# Patient Record
Sex: Female | Born: 1960 | Race: White | Hispanic: No | Marital: Single | Smoking: Never smoker
Health system: Southern US, Community
[De-identification: ages and names within clinical notes are randomized; demographics above are authoritative.]

---

## 2016-09-07 DIAGNOSIS — Z803 Family history of malignant neoplasm of breast: Secondary | ICD-10-CM | POA: Insufficient documentation

## 2017-08-05 DIAGNOSIS — E785 Hyperlipidemia, unspecified: Secondary | ICD-10-CM | POA: Insufficient documentation

## 2017-08-05 DIAGNOSIS — E559 Vitamin D deficiency, unspecified: Secondary | ICD-10-CM | POA: Insufficient documentation

## 2019-06-14 DIAGNOSIS — J310 Chronic rhinitis: Secondary | ICD-10-CM | POA: Diagnosis not present

## 2019-06-14 DIAGNOSIS — J343 Hypertrophy of nasal turbinates: Secondary | ICD-10-CM | POA: Diagnosis not present

## 2019-06-14 DIAGNOSIS — G4733 Obstructive sleep apnea (adult) (pediatric): Secondary | ICD-10-CM | POA: Diagnosis not present

## 2019-08-15 DIAGNOSIS — G4733 Obstructive sleep apnea (adult) (pediatric): Secondary | ICD-10-CM | POA: Diagnosis not present

## 2019-09-15 DIAGNOSIS — R05 Cough: Secondary | ICD-10-CM | POA: Diagnosis not present

## 2019-09-15 DIAGNOSIS — J029 Acute pharyngitis, unspecified: Secondary | ICD-10-CM | POA: Diagnosis not present

## 2019-09-15 DIAGNOSIS — Z20828 Contact with and (suspected) exposure to other viral communicable diseases: Secondary | ICD-10-CM | POA: Diagnosis not present

## 2019-09-15 DIAGNOSIS — G4733 Obstructive sleep apnea (adult) (pediatric): Secondary | ICD-10-CM | POA: Diagnosis not present

## 2019-10-08 DIAGNOSIS — J3489 Other specified disorders of nose and nasal sinuses: Secondary | ICD-10-CM | POA: Diagnosis not present

## 2019-10-08 DIAGNOSIS — Z20828 Contact with and (suspected) exposure to other viral communicable diseases: Secondary | ICD-10-CM | POA: Diagnosis not present

## 2019-10-15 DIAGNOSIS — G4733 Obstructive sleep apnea (adult) (pediatric): Secondary | ICD-10-CM | POA: Diagnosis not present

## 2019-11-15 DIAGNOSIS — G4733 Obstructive sleep apnea (adult) (pediatric): Secondary | ICD-10-CM | POA: Diagnosis not present

## 2019-12-16 DIAGNOSIS — G4733 Obstructive sleep apnea (adult) (pediatric): Secondary | ICD-10-CM | POA: Diagnosis not present

## 2019-12-28 ENCOUNTER — Ambulatory Visit: Payer: Self-pay | Attending: Family

## 2019-12-28 DIAGNOSIS — Z23 Encounter for immunization: Secondary | ICD-10-CM | POA: Insufficient documentation

## 2019-12-28 NOTE — Progress Notes (Signed)
   Covid-19 Vaccination Clinic  Name:  Amy Griffin    MRN: [id number] DOB: 26-Aug-1961  12/28/2019  [name] was observed post Covid-19 immunization for 15 minutes without incident. She was provided with Vaccine Information Sheet and instruction to access the V-Safe system.   [name] was instructed to call 911 with any severe reactions post vaccine: Marland Kitchen Difficulty breathing  . Swelling of face and throat  . A fast heartbeat  . A bad rash all over body  . Dizziness and weakness   Immunizations Administered    Name Date Dose VIS Date Route   Moderna COVID-19 Vaccine 12/28/2019  1:06 PM 0.5 mL 09/26/2019 Intramuscular   Manufacturer: Moderna   Lot: [id number]   [id number]

## 2019-12-30 ENCOUNTER — Ambulatory Visit: Payer: Self-pay

## 2020-01-13 DIAGNOSIS — G4733 Obstructive sleep apnea (adult) (pediatric): Secondary | ICD-10-CM | POA: Diagnosis not present

## 2020-01-17 DIAGNOSIS — G4733 Obstructive sleep apnea (adult) (pediatric): Secondary | ICD-10-CM | POA: Diagnosis not present

## 2020-01-30 ENCOUNTER — Ambulatory Visit: Payer: Self-pay | Attending: Family

## 2020-01-30 DIAGNOSIS — Z23 Encounter for immunization: Secondary | ICD-10-CM

## 2020-01-30 NOTE — Progress Notes (Signed)
   Covid-19 Vaccination Clinic  Name:  [name]    MRN: [id number] DOB: 03-Apr-1961  01/30/2020  [name] was observed post Covid-19 immunization for 15 minutes without incident. She was provided with Vaccine Information Sheet and instruction to access the V-Safe system.   [name] was instructed to call 911 with any severe reactions post vaccine: Marland Kitchen Difficulty breathing  . Swelling of face and throat  . A fast heartbeat  . A bad rash all over body  . Dizziness and weakness   Immunizations Administered    Name Date Dose VIS Date Route   Moderna COVID-19 Vaccine 01/30/2020  9:52 AM 0.5 mL 09/26/2019 Intramuscular   Manufacturer: Moderna   Lot: [id number]   [id number]

## 2020-02-13 DIAGNOSIS — G4733 Obstructive sleep apnea (adult) (pediatric): Secondary | ICD-10-CM | POA: Diagnosis not present

## 2020-03-14 DIAGNOSIS — G4733 Obstructive sleep apnea (adult) (pediatric): Secondary | ICD-10-CM | POA: Diagnosis not present

## 2020-04-14 DIAGNOSIS — G4733 Obstructive sleep apnea (adult) (pediatric): Secondary | ICD-10-CM | POA: Diagnosis not present

## 2020-04-18 DIAGNOSIS — R030 Elevated blood-pressure reading, without diagnosis of hypertension: Secondary | ICD-10-CM | POA: Diagnosis not present

## 2020-04-18 DIAGNOSIS — E7800 Pure hypercholesterolemia, unspecified: Secondary | ICD-10-CM | POA: Diagnosis not present

## 2020-04-18 DIAGNOSIS — Z0000 Encounter for general adult medical examination without abnormal findings: Secondary | ICD-10-CM | POA: Diagnosis not present

## 2020-05-17 ENCOUNTER — Other Ambulatory Visit: Payer: Self-pay | Admitting: Family Medicine

## 2020-05-17 DIAGNOSIS — Z1231 Encounter for screening mammogram for malignant neoplasm of breast: Secondary | ICD-10-CM

## 2020-06-07 ENCOUNTER — Other Ambulatory Visit: Payer: Self-pay

## 2020-06-07 ENCOUNTER — Ambulatory Visit
Admission: RE | Admit: 2020-06-07 | Discharge: 2020-06-07 | Disposition: A | Discharge status: Home / Self Care | Payer: BC Managed Care – PPO | Source: Ambulatory Visit | Attending: Family Medicine | Admitting: Family Medicine

## 2020-06-07 DIAGNOSIS — Z1231 Encounter for screening mammogram for malignant neoplasm of breast: Secondary | ICD-10-CM | POA: Diagnosis not present

## 2020-06-10 DIAGNOSIS — G4733 Obstructive sleep apnea (adult) (pediatric): Secondary | ICD-10-CM | POA: Diagnosis not present

## 2020-07-03 DIAGNOSIS — Z800 Family history of malignant neoplasm of digestive organs: Secondary | ICD-10-CM | POA: Diagnosis not present

## 2020-07-03 DIAGNOSIS — Z86010 Personal history of colonic polyps: Secondary | ICD-10-CM | POA: Diagnosis not present

## 2020-07-03 DIAGNOSIS — K219 Gastro-esophageal reflux disease without esophagitis: Secondary | ICD-10-CM | POA: Diagnosis not present

## 2020-07-12 DIAGNOSIS — Z1159 Encounter for screening for other viral diseases: Secondary | ICD-10-CM | POA: Diagnosis not present

## 2020-07-17 DIAGNOSIS — K635 Polyp of colon: Secondary | ICD-10-CM | POA: Diagnosis not present

## 2020-07-17 DIAGNOSIS — Z86010 Personal history of colonic polyps: Secondary | ICD-10-CM | POA: Diagnosis not present

## 2020-07-17 DIAGNOSIS — K640 First degree hemorrhoids: Secondary | ICD-10-CM | POA: Diagnosis not present

## 2020-07-17 DIAGNOSIS — K621 Rectal polyp: Secondary | ICD-10-CM | POA: Diagnosis not present

## 2020-07-17 DIAGNOSIS — D122 Benign neoplasm of ascending colon: Secondary | ICD-10-CM | POA: Diagnosis not present

## 2020-07-17 DIAGNOSIS — Z800 Family history of malignant neoplasm of digestive organs: Secondary | ICD-10-CM | POA: Diagnosis not present

## 2020-07-30 DIAGNOSIS — L918 Other hypertrophic disorders of the skin: Secondary | ICD-10-CM | POA: Diagnosis not present

## 2020-07-30 DIAGNOSIS — D1801 Hemangioma of skin and subcutaneous tissue: Secondary | ICD-10-CM | POA: Diagnosis not present

## 2020-07-30 DIAGNOSIS — L814 Other melanin hyperpigmentation: Secondary | ICD-10-CM | POA: Diagnosis not present

## 2020-07-30 DIAGNOSIS — L570 Actinic keratosis: Secondary | ICD-10-CM | POA: Diagnosis not present

## 2020-07-30 DIAGNOSIS — L578 Other skin changes due to chronic exposure to nonionizing radiation: Secondary | ICD-10-CM | POA: Diagnosis not present

## 2020-09-11 DIAGNOSIS — E7800 Pure hypercholesterolemia, unspecified: Secondary | ICD-10-CM | POA: Diagnosis not present

## 2020-09-11 DIAGNOSIS — R030 Elevated blood-pressure reading, without diagnosis of hypertension: Secondary | ICD-10-CM | POA: Diagnosis not present

## 2020-09-25 ENCOUNTER — Other Ambulatory Visit: Payer: Self-pay

## 2020-09-25 ENCOUNTER — Ambulatory Visit (INDEPENDENT_AMBULATORY_CARE_PROVIDER_SITE_OTHER): Payer: BC Managed Care – PPO | Admitting: Otolaryngology

## 2020-09-25 ENCOUNTER — Encounter (INDEPENDENT_AMBULATORY_CARE_PROVIDER_SITE_OTHER): Payer: Self-pay | Admitting: Otolaryngology

## 2020-09-25 VITALS — Temp 97.3°F

## 2020-09-25 DIAGNOSIS — G4733 Obstructive sleep apnea (adult) (pediatric): Secondary | ICD-10-CM | POA: Diagnosis not present

## 2020-09-25 DIAGNOSIS — D229 Melanocytic nevi, unspecified: Secondary | ICD-10-CM | POA: Diagnosis not present

## 2020-09-25 NOTE — Progress Notes (Signed)
HPI: Amy Griffin is a 59 y.o. female who returns today for evaluation of obstructive sleep apnea and use of nasal CPAP.  She states that she uses CPAP most of the time.  On her compliance report her usage is 80%.  I reviewed the sleep test with her today in the office that showed moderate severe obstructive sleep apnea with O2 sats below 88% of the time for 20 minutes while she slept which was 6% and the lowest O2 sat down to 77%. She also inquires about a mole on the right cheek just lateral to the nose that has gotten a little bit larger but she has had for a number of years.. She is on no blood thinners.  No past medical history on file. No past surgical history on file. Social History   Socioeconomic History   Marital status: Single    Spouse name: Not on file   Number of children: Not on file   Years of education: Not on file   Highest education level: Not on file  Occupational History   Not on file  Tobacco Use   Smoking status: Never Smoker   Smokeless tobacco: Never Used  Substance and Sexual Activity   Alcohol use: Not on file   Drug use: Not on file   Sexual activity: Not on file  Other Topics Concern   Not on file  Social History Narrative   Not on file   Social Determinants of Health   Financial Resource Strain:    Difficulty of Paying Living Expenses: Not on file  Food Insecurity:    Worried About [place] in the Last Year: Not on file   Ran Out of Food in the Last Year: Not on file  Transportation Needs:    Lack of Transportation (Medical): Not on file   Lack of Transportation (Non-Medical): Not on file  Physical Activity:    Days of Exercise per Week: Not on file   Minutes of Exercise per Session: Not on file  Stress:    Feeling of Stress : Not on file  Social Connections:    Frequency of Communication with Friends and Family: Not on file   Frequency of Social Gatherings with Friends and Family: Not on file    Attends Religious Services: Not on file   Active Member of Clubs or Organizations: Not on file   Attends Archivist Meetings: Not on file   Marital Status: Not on file   Family History  Problem Relation Age of Onset   Breast cancer Mother    No Known Allergies Prior to Admission medications   Not on File     Positive ROS: Otherwise negative  All other systems have been reviewed and were otherwise negative with the exception of those mentioned in the HPI and as above.  Physical Exam: Constitutional: Alert, well-appearing, no acute distress Ears: External ears without lesions or tenderness. Ear canals are clear bilaterally with intact, clear TMs.  Nasal: External nose without lesions. Septum midline with mild rhinitis. Clear nasal passages otherwise. Oral: Lips and gums without lesions. Tongue and palate mucosa without lesions. Posterior oropharynx clear. Neck: No palpable adenopathy or masses Respiratory: Breathing comfortably  Skin: No facial/neck lesions or rash noted.  Patient has a 6 to 8 mm polypoid mole just below and lateral to the right nostril.  This appears benign.  Procedures  Assessment: Obstructive sleep apnea.  Her compliance report revealed that she uses the CPAP machine 80% of  the time. Right cheek mole.  Plan: Would recommend continue use of nasal CPAP and reviewed the sleep test with her today. Her compliance is presently 80%. Briefly reviewed with her concerning excision of the mole under local anesthesia in which she will call us back if she decides to have this pursued.   Radene Journey, MD

## 2020-12-09 DIAGNOSIS — M9905 Segmental and somatic dysfunction of pelvic region: Secondary | ICD-10-CM | POA: Diagnosis not present

## 2020-12-09 DIAGNOSIS — M9903 Segmental and somatic dysfunction of lumbar region: Secondary | ICD-10-CM | POA: Diagnosis not present

## 2020-12-09 DIAGNOSIS — M7918 Myalgia, other site: Secondary | ICD-10-CM | POA: Diagnosis not present

## 2020-12-09 DIAGNOSIS — M9904 Segmental and somatic dysfunction of sacral region: Secondary | ICD-10-CM | POA: Diagnosis not present

## 2020-12-12 DIAGNOSIS — M9904 Segmental and somatic dysfunction of sacral region: Secondary | ICD-10-CM | POA: Diagnosis not present

## 2020-12-12 DIAGNOSIS — M7918 Myalgia, other site: Secondary | ICD-10-CM | POA: Diagnosis not present

## 2020-12-12 DIAGNOSIS — M9905 Segmental and somatic dysfunction of pelvic region: Secondary | ICD-10-CM | POA: Diagnosis not present

## 2020-12-12 DIAGNOSIS — M9903 Segmental and somatic dysfunction of lumbar region: Secondary | ICD-10-CM | POA: Diagnosis not present

## 2021-01-13 NOTE — Progress Notes (Signed)
    Subjective:    CC: Left buttock pain  I, Molly Weber, LAT, ATC, am serving as scribe for Dr. Lynne Leader.  HPI: Pt is a 60 y/o female presenting w/ L buttock pain ongoing since Nov. Pt locates her pain to deep L buttock. Chiro dx w/ tight piriformis. She has tried some stretching which has helped a little,  Low back pain: no Radiating pain: no LE numbness/tingling: no Aggravating factors: worse in morning, carry geriatric dog Treatments tried: chiro, stretches, tennis ball, TENS, heat   Pertinent review of Systems: No fevers or chills  Relevant historical information: HLD   Objective:    Vitals:   01/14/21 0822  BP: 118/83  Pulse: 81  SpO2: 96%   General: Well Developed, well nourished, and in no acute distress.   MSK: Lspine: Normal appearing.  Decreased motion.  Normal BL LE strength except noted below.  Reflex and sensation equal and normal BL.   Left hip normal-appearing Normal motion. Tender palpation piriformis area. Hip strength intact abduction.  Reduced hip strength external rotation.  Some pain with resisted hip external rotational strength testing.  Lab and Radiology Results  X-ray images lumbar spine and left hip obtained today personally independently interpreted  L-spine: Mild DDD No fx.   Left hip: No significant DJD.  No fractures malalignment.  Await formal radiology review   Impression and Recommendations:    Assessment and Plan: 60 y.o. female with left buttocks pain primarily piriformis area.  Likely muscle dysfunction tendinopathy and weakness.  Plan for physical therapy.  Await radiology overread x-rays today.  Recheck back in about 6 weeks.  PDMP not reviewed this encounter. Orders Placed This Encounter  Procedures  . DG HIP UNILAT WITH PELVIS 2-3 VIEWS LEFT    Standing Status:   Future    Number of Occurrences:   1    Standing Expiration Date:   01/14/2022    Order Specific Question:   Reason for Exam (SYMPTOM  OR  DIAGNOSIS REQUIRED)    Answer:   eval left hip pain    Order Specific Question:   Preferred imaging location?    Answer:   [facility]  . DG Lumbar Spine 2-3 Views    Standing Status:   Future    Number of Occurrences:   1    Standing Expiration Date:   01/14/2022    Order Specific Question:   Reason for Exam (SYMPTOM  OR DIAGNOSIS REQUIRED)    Answer:   eval lumbar    Order Specific Question:   Preferred imaging location?    Answer:   [facility]  . Ambulatory referral to Physical Therapy    Referral Priority:   Routine    Referral Type:   Physical Medicine    Referral Reason:   Specialty Services Required    Requested Specialty:   Physical Therapy   No orders of the defined types were placed in this encounter.   Discussed warning signs or symptoms. Please see discharge instructions. Patient expresses understanding.   The above documentation has been reviewed and is accurate and complete Lynne Leader, M.D.

## 2021-01-14 ENCOUNTER — Ambulatory Visit (INDEPENDENT_AMBULATORY_CARE_PROVIDER_SITE_OTHER): Payer: BC Managed Care – PPO

## 2021-01-14 ENCOUNTER — Ambulatory Visit (INDEPENDENT_AMBULATORY_CARE_PROVIDER_SITE_OTHER): Payer: BC Managed Care – PPO | Admitting: Family Medicine

## 2021-01-14 ENCOUNTER — Other Ambulatory Visit: Payer: Self-pay

## 2021-01-14 VITALS — BP 118/83 | HR 81 | Ht 65.5 in | Wt 177.8 lb

## 2021-01-14 DIAGNOSIS — M4186 Other forms of scoliosis, lumbar region: Secondary | ICD-10-CM | POA: Diagnosis not present

## 2021-01-14 DIAGNOSIS — M25552 Pain in left hip: Secondary | ICD-10-CM | POA: Diagnosis not present

## 2021-01-14 DIAGNOSIS — G5702 Lesion of sciatic nerve, left lower limb: Secondary | ICD-10-CM | POA: Diagnosis not present

## 2021-01-14 DIAGNOSIS — Z860100 Personal history of colon polyps, unspecified: Secondary | ICD-10-CM | POA: Insufficient documentation

## 2021-01-14 DIAGNOSIS — E7800 Pure hypercholesterolemia, unspecified: Secondary | ICD-10-CM | POA: Insufficient documentation

## 2021-01-14 DIAGNOSIS — M47816 Spondylosis without myelopathy or radiculopathy, lumbar region: Secondary | ICD-10-CM | POA: Diagnosis not present

## 2021-01-14 DIAGNOSIS — K219 Gastro-esophageal reflux disease without esophagitis: Secondary | ICD-10-CM | POA: Insufficient documentation

## 2021-01-14 NOTE — Patient Instructions (Signed)
Thank you for coming in today.  Please get an Xray today before you leave  I've referred you to Physical Therapy.  Let us know if you don't hear from them in one week.  Recheck in 6  Weeks.   Let me know if you have a problem.    Piriformis Syndrome  Piriformis syndrome is a condition that can cause pain and numbness in your buttocks and down the back of your leg. Piriformis syndrome happens when the small muscle that connects the base of your spine to your hip (piriformis muscle) presses on the nerve that runs down the back of your leg (sciatic nerve). The piriformis muscle helps your hip rotate and helps to bring your leg back and out. It also helps shift your weight to keep you stable while you are walking. The sciatic nerve runs under or through the piriformis muscle. Damage to the piriformis muscle can cause spasms that put pressure on the nerve below. This causes pain and discomfort while sitting and moving. The pain may feel as if it begins in the buttock and spreads (radiates) down your hip and thigh. What are the causes? This condition is caused by pressure on the sciatic nerve from the piriformis muscle. The piriformis muscle can get irritated with overuse, especially if other hip muscles are weak and the piriformis muscle has to do extra work. Piriformis syndrome can also occur after an injury, like a fall onto your buttocks. What increases the risk? You are more likely to develop this condition if you:  Are a woman.  Sit for long periods of time.  Are a cyclist.  Have weak buttocks muscles (gluteal muscles). What are the signs or symptoms? Symptoms of this condition include:  Pain, tingling, or numbness that starts in the buttock and runs down the back of your leg (sciatica).  Pain in the groin or thigh area. Your symptoms may get worse:  The longer you sit.  When you walk, run, or climb stairs.  When straining to have a bowel movement. How is this  diagnosed? This condition is diagnosed based on your symptoms, medical history, and physical exam.  During the exam, your health care provider may: ? Move your leg into different positions to check for pain. ? Press on the muscles of your hip and buttock to see if that increases your symptoms.  You may also have tests, including: ? Imaging tests such as X-rays, MRI, or ultrasound. ? Electromyogram (EMG). This test measures electrical signals sent by your nerves into the muscles. ? Nerve conduction study. This test measures how well electrical signals pass through your nerves. How is this treated? This condition may be treated by:  Stopping all activities that cause pain or make your condition worse.  Applying ice or using heat therapy.  Taking medicines to reduce pain and swelling.  Taking a muscle relaxer (muscle relaxant) to stop muscle spasms.  Doing range-of-motion and strengthening exercises (physical therapy) as told by your health care provider.  Massaging the area.  Having acupuncture.  Getting an injection of medicine in the piriformis muscle. Your health care provider will choose the medicine based on your condition. He or she may inject: ? An anti-inflammatory medicine (steroid) to reduce swelling. ? A numbing medicine (local anesthetic) to block the pain. ? Botulinum toxin. The toxin blocks nerve impulses to specific muscles to reduce muscle tension. In rare cases, you may need surgery to cut the muscle and release pressure on the nerve if other  treatments do not work. Follow these instructions at home: Activity  Do not sit for long periods. Get up and walk around every 20 minutes or as often as told by your health care provider. ? When driving long distances, make sure to take frequent stops to get up and stretch.  Use a cushion when you sit on hard surfaces.  Do exercises as told by your health care provider.  Return to your normal activities as told by your  health care provider. Ask your health care provider what activities are safe for you. Managing pain, stiffness, and swelling  If directed, apply heat to the affected area as often as told by your health care provider. Use the heat source that your health care provider recommends, such as a moist heat pack or a heating pad. ? Place a towel between your skin and the heat source. ? Leave the heat on for 20-30 minutes. ? Remove the heat if your skin turns bright red. This is especially important if you are unable to feel pain, heat, or cold. You may have a greater risk of getting burned.  If directed, put ice on the injured area. ? Put ice in a plastic bag. ? Place a towel between your skin and the bag. ? Leave the ice on for 20 minutes, 2-3 times a day.      General instructions  Take over-the-counter and prescription medicines only as told by your health care provider.  Ask your health care provider if the medicine prescribed to you requires you to avoid driving or using heavy machinery.  You may need to take actions to prevent or treat constipation, such as: ? Drink enough fluid to keep your urine pale yellow. ? Take over-the-counter or prescription medicines. ? Eat foods that are high in fiber, such as beans, whole grains, and fresh fruits and vegetables. ? Limit foods that are high in fat and processed sugars, such as fried or sweet foods.  Keep all follow-up visits as told by your health care provider. This is important. How is this prevented?  Do not sit for longer than 20 minutes at a time. When you sit, choose padded surfaces.  Warm up and stretch before being active.  Cool down and stretch after being active.  Give your body time to rest between periods of activity.  Make sure to use equipment that fits you.  Maintain physical fitness, including: ? Strength. ? Flexibility. Contact a health care provider if:  Your pain and stiffness continue or get worse.  Your leg  or hip becomes weak.  You have changes in your bowel function or bladder function. Summary  Piriformis syndrome is a condition that can cause pain, tingling, and numbness in your buttocks and down the back of your leg.  You may try applying heat or ice to relieve the pain.  Do not sit for long periods. Get up and walk around every 20 minutes or as often as told by your health care provider. This information is not intended to replace advice given to you by your health care provider. Make sure you discuss any questions you have with your health care provider. Document Revised: 02/02/2019 Document Reviewed: 06/08/2018 Elsevier Patient Education  [place].

## 2021-01-15 NOTE — Progress Notes (Signed)
X-ray lumbar spine shows some areas of arthritis.  No fractures.  A little bit of scoliosis is also seen.

## 2021-01-15 NOTE — Progress Notes (Signed)
X-ray left hip shows no fracture.  Concern for impingement is present on x-ray.

## 2021-01-31 ENCOUNTER — Ambulatory Visit: Payer: BC Managed Care – PPO | Admitting: Physical Therapy

## 2021-03-06 NOTE — Progress Notes (Deleted)
   I, Wendy Poet, LAT, ATC, am serving as scribe for Dr. Lynne Leader.  Kaytelynn Scripter is a 60 y.o. female who presents to [place] at [facility] today for f/u of L buttock pain / piriformis syndrome that began in Nov 2021.  She was last seen by Dr. Georgina Snell on 01/14/21 and was referred to PT of which she has not attended any sessions.  She had previously tried chiropractic treatment, tennis ball massage, stretching, etc.  Since then, pt reports  Diagnostic imaging: L hip and L-spine XR- 01/14/21  Pertinent review of systems: ***  Relevant historical information: ***   Exam:  There were no vitals taken for this visit. General: Well Developed, well nourished, and in no acute distress.   MSK: ***    Lab and Radiology Results No results found for this or any previous visit (from the past 72 hour(s)). No results found.     Assessment and Plan: 60 y.o. female with ***   PDMP not reviewed this encounter. No orders of the defined types were placed in this encounter.  No orders of the defined types were placed in this encounter.    Discussed warning signs or symptoms. Please see discharge instructions. Patient expresses understanding.   ***

## 2021-03-07 ENCOUNTER — Ambulatory Visit: Payer: BC Managed Care – PPO | Admitting: Family Medicine

## 2021-04-21 DIAGNOSIS — G4733 Obstructive sleep apnea (adult) (pediatric): Secondary | ICD-10-CM | POA: Diagnosis not present

## 2021-05-16 ENCOUNTER — Ambulatory Visit: Payer: BC Managed Care – PPO | Attending: Internal Medicine

## 2021-05-16 ENCOUNTER — Other Ambulatory Visit: Payer: BC Managed Care – PPO

## 2021-05-16 DIAGNOSIS — Z20822 Contact with and (suspected) exposure to covid-19: Secondary | ICD-10-CM

## 2021-05-17 LAB — NOVEL CORONAVIRUS, NAA: SARS-CoV-2, NAA: NOT DETECTED

## 2021-05-17 LAB — SARS-COV-2, NAA 2 DAY TAT

## 2021-06-10 DIAGNOSIS — E7800 Pure hypercholesterolemia, unspecified: Secondary | ICD-10-CM | POA: Diagnosis not present

## 2021-06-13 DIAGNOSIS — Z0000 Encounter for general adult medical examination without abnormal findings: Secondary | ICD-10-CM | POA: Diagnosis not present

## 2021-07-07 ENCOUNTER — Other Ambulatory Visit: Payer: Self-pay | Admitting: Family Medicine

## 2021-07-07 DIAGNOSIS — Z1231 Encounter for screening mammogram for malignant neoplasm of breast: Secondary | ICD-10-CM

## 2021-07-08 ENCOUNTER — Ambulatory Visit
Admission: RE | Admit: 2021-07-08 | Discharge: 2021-07-08 | Disposition: A | Discharge status: Home / Self Care | Payer: BC Managed Care – PPO | Source: Ambulatory Visit | Attending: Family Medicine | Admitting: Family Medicine

## 2021-07-08 ENCOUNTER — Other Ambulatory Visit: Payer: Self-pay

## 2021-07-08 DIAGNOSIS — Z1231 Encounter for screening mammogram for malignant neoplasm of breast: Secondary | ICD-10-CM | POA: Diagnosis not present

## 2021-07-14 ENCOUNTER — Other Ambulatory Visit: Payer: Self-pay | Admitting: Family Medicine

## 2021-07-14 DIAGNOSIS — R928 Other abnormal and inconclusive findings on diagnostic imaging of breast: Secondary | ICD-10-CM

## 2021-07-21 ENCOUNTER — Ambulatory Visit
Admission: RE | Admit: 2021-07-21 | Discharge: 2021-07-21 | Disposition: A | Discharge status: Home / Self Care | Payer: BC Managed Care – PPO | Source: Ambulatory Visit | Attending: Family Medicine | Admitting: Family Medicine

## 2021-07-21 ENCOUNTER — Other Ambulatory Visit: Payer: Self-pay

## 2021-07-21 DIAGNOSIS — R928 Other abnormal and inconclusive findings on diagnostic imaging of breast: Secondary | ICD-10-CM

## 2021-07-21 DIAGNOSIS — R922 Inconclusive mammogram: Secondary | ICD-10-CM | POA: Diagnosis not present

## 2021-07-29 ENCOUNTER — Other Ambulatory Visit: Payer: BC Managed Care – PPO

## 2021-07-31 DIAGNOSIS — L821 Other seborrheic keratosis: Secondary | ICD-10-CM | POA: Diagnosis not present

## 2021-07-31 DIAGNOSIS — L814 Other melanin hyperpigmentation: Secondary | ICD-10-CM | POA: Diagnosis not present

## 2021-07-31 DIAGNOSIS — L570 Actinic keratosis: Secondary | ICD-10-CM | POA: Diagnosis not present

## 2021-07-31 DIAGNOSIS — D225 Melanocytic nevi of trunk: Secondary | ICD-10-CM | POA: Diagnosis not present

## 2021-07-31 DIAGNOSIS — L738 Other specified follicular disorders: Secondary | ICD-10-CM | POA: Diagnosis not present

## 2023-09-23 IMAGING — MG MM DIGITAL DIAGNOSTIC UNILAT*L* W/ TOMO W/ CAD
8 series · 8 of 24 positions shown · non-contrast
Comparison: Previous exam(s).

CLINICAL DATA: Recall from screening mammography, 3 possible masses
involving the LEFT breast, most conspicuous on the CC view.

EXAM:
DIGITAL DIAGNOSTIC UNILATERAL LEFT MAMMOGRAM WITH TOMOSYNTHESIS AND
CAD; ULTRASOUND LEFT BREAST LIMITED
TECHNIQUE: Left digital diagnostic mammography and breast tomosynthesis was
performed. The images were evaluated with computer-aided detection.;
Targeted ultrasound examination of the left breast was performed.

[L CC synth-2D (1 of 2)]
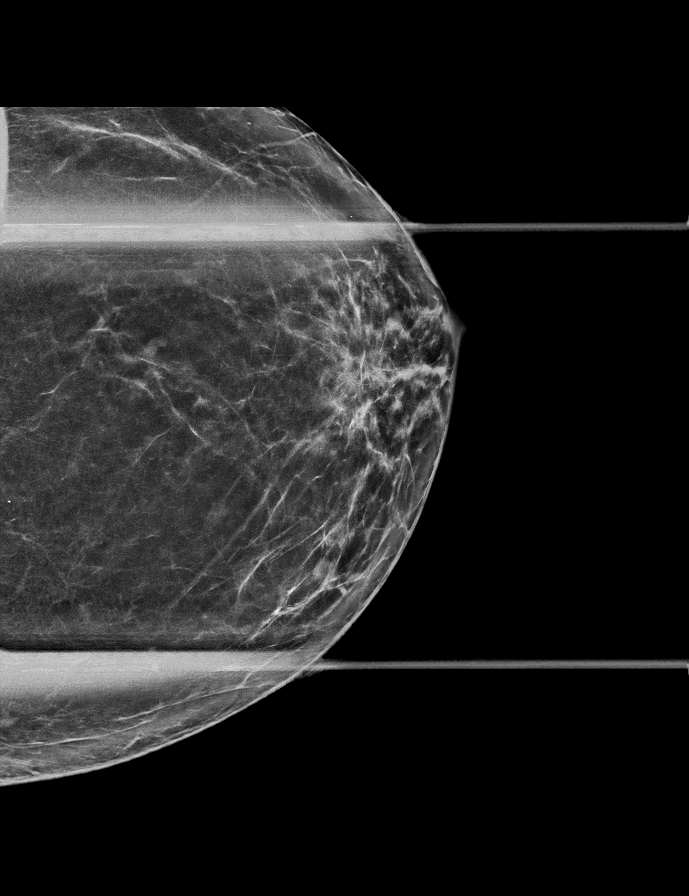

[L CC synth-2D (2 of 2)]
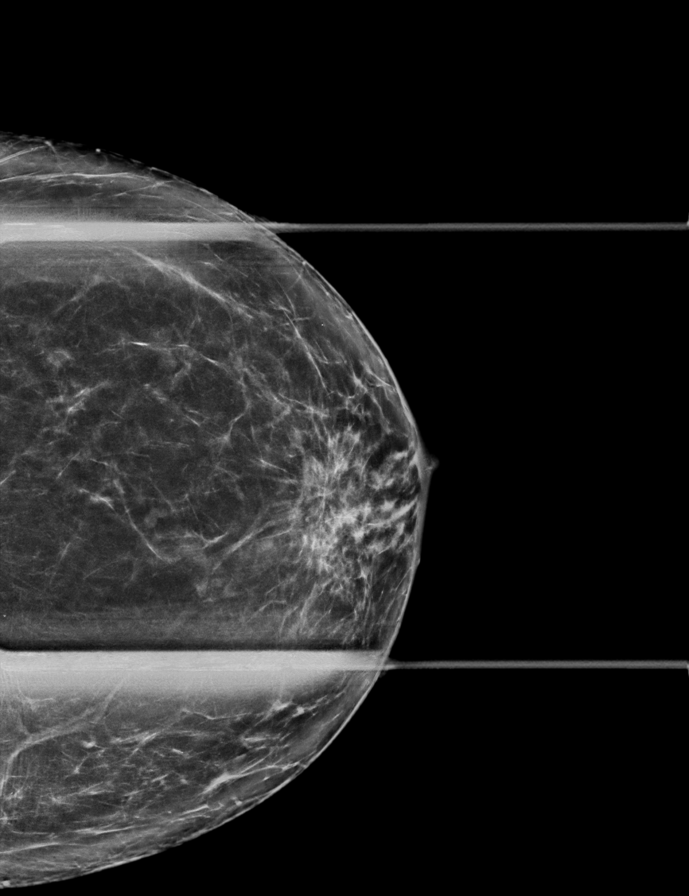

[L ML synth-2D (1 of 2)]
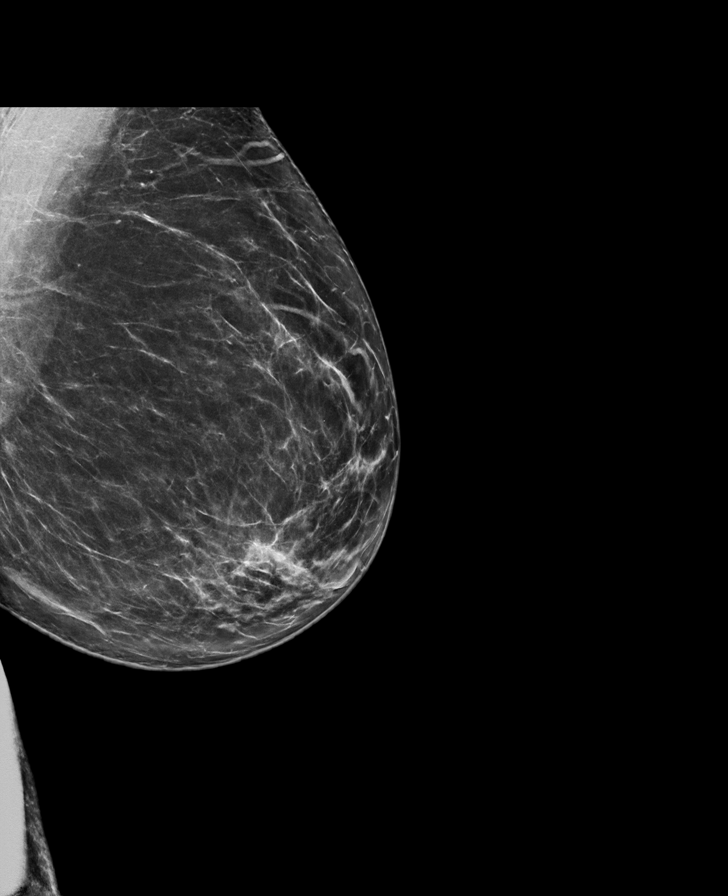

[L ML synth-2D (2 of 2)]
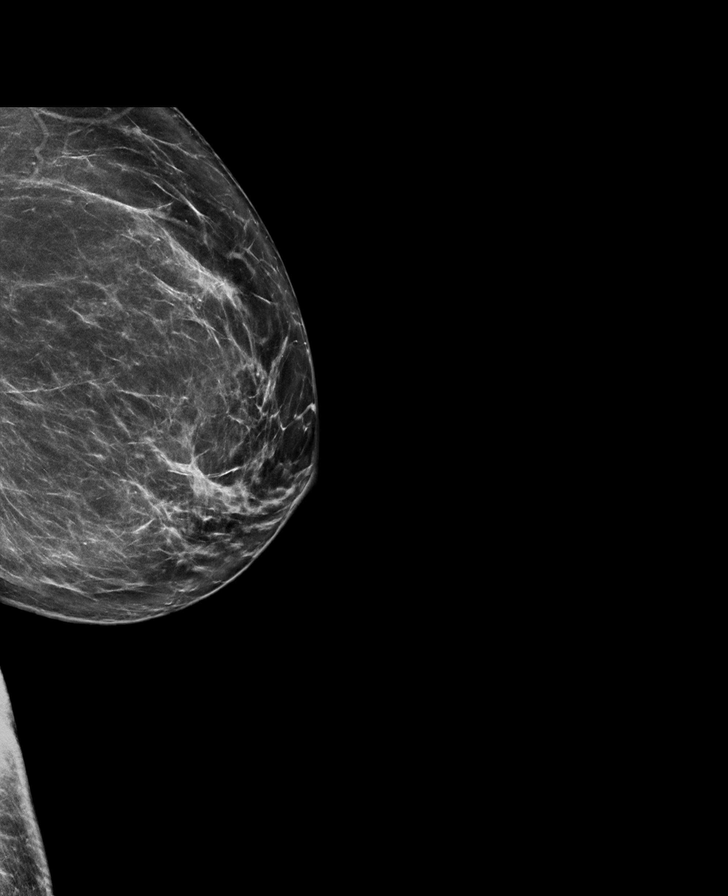

[L CC tomo (1 of 2) · tomo slice 37/74.0]
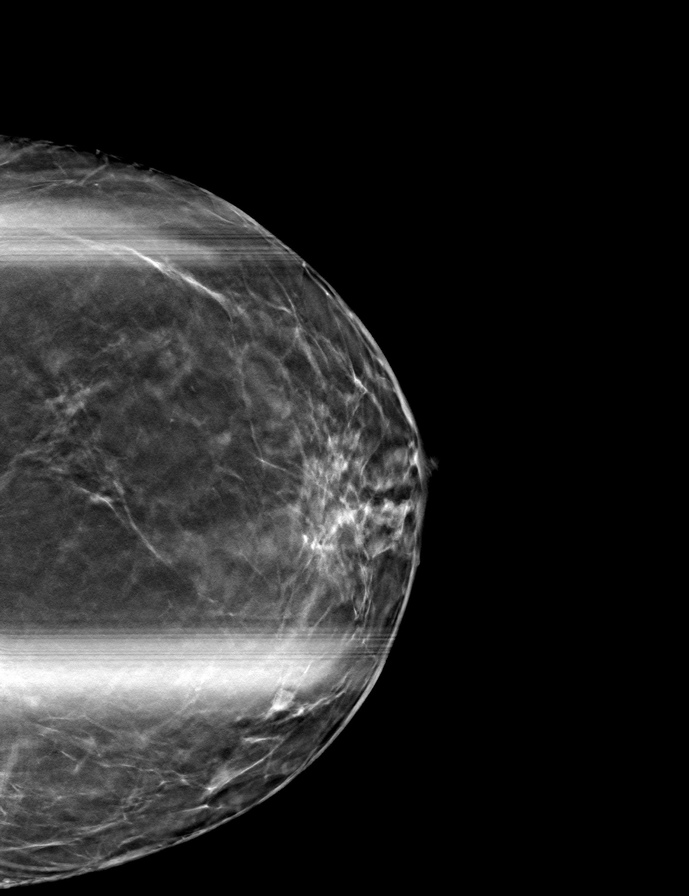

[L ML tomo (1 of 2) · tomo slice 41/82.0]
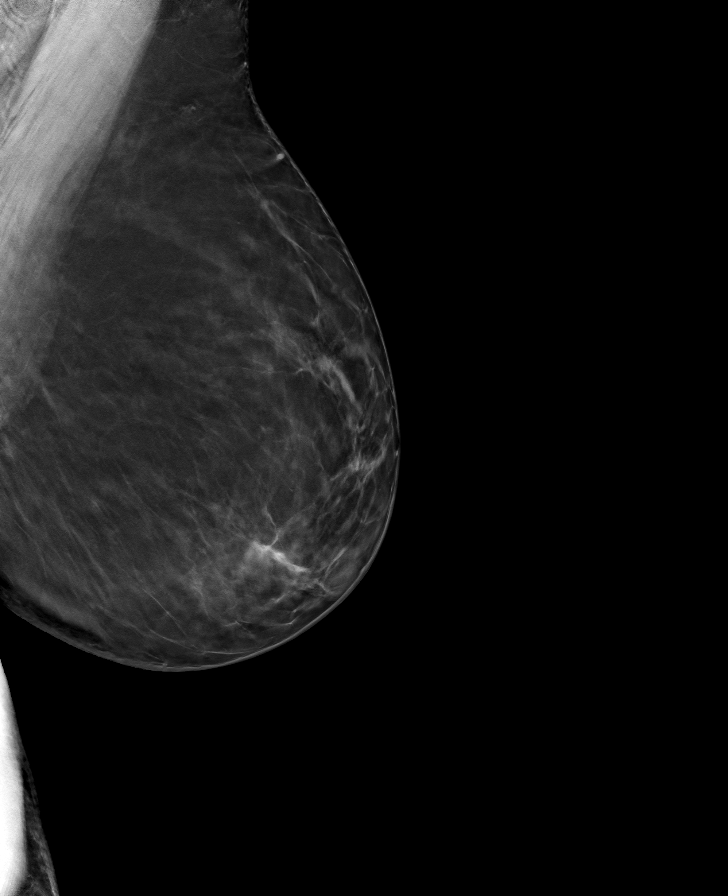

[L CC tomo (2 of 2) · tomo slice 37/74.0]
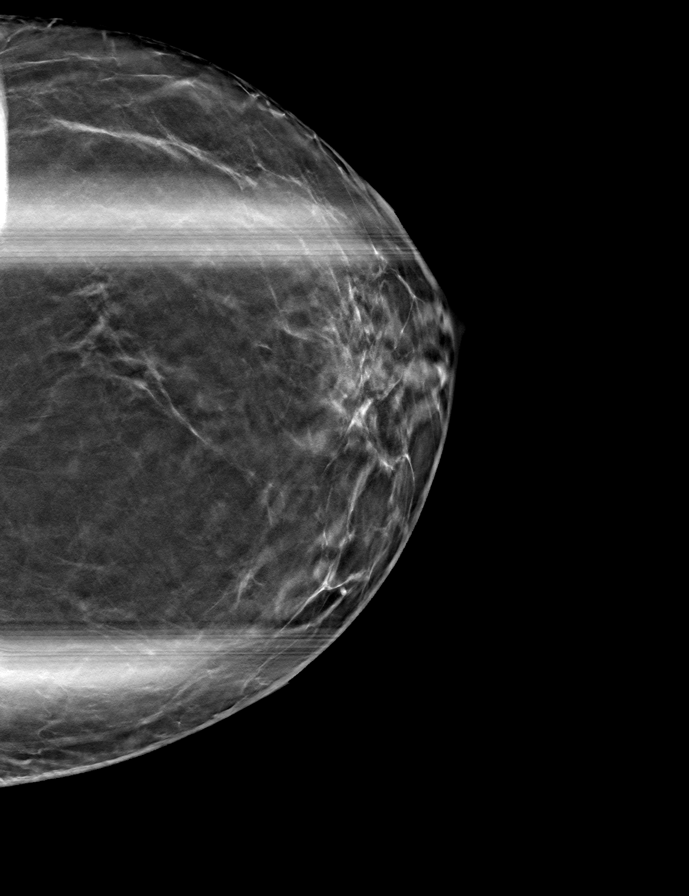

[L ML tomo (2 of 2) · tomo slice 43/86.0]
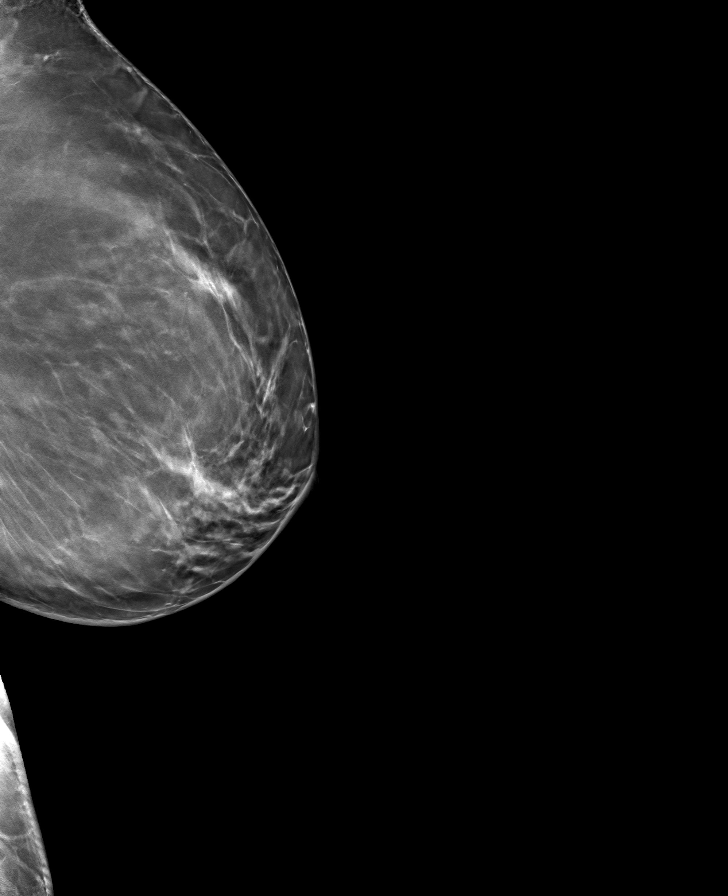

[8 of 24 positions shown; findings below may reference images not displayed]

ACR Breast Density Category b: There are scattered areas of
fibroglandular density.
FINDINGS: Spot-compression CC view of the area of concern and a full field
mediolateral view were obtained.

In the UPPER OUTER QUADRANT at middle depth is a circumscribed
isodense mass measuring approximately 5 mm without associated
architectural distortion or suspicious calcifications.

In the lower breast at middle depth is a circumscribed bilobed
isodense mass measuring approximately 6 mm without associated
architectural distortion or suspicious calcifications.

In the inner breast at anterior to middle depth, likely near 9
o'clock or in the UPPER INNER QUADRANT, is an isodense mass with
macrolobular margins measuring approximately 8 mm, without
associated architectural distortion or suspicious calcifications,
possibly clustered cysts.

Targeted ultrasound is performed, demonstrating multiple benign
findings including:

-Circumscribed oval parallel anechoic mass with scattered internal
echoes at the 1 o'clock position 6 cm from the nipple measuring
approximately 6 x 2 x 5 mm, demonstrating posterior acoustic
enhancement and no internal power Doppler flow.

-Adjacent anechoic masses with scattered internal echoes versus a
bilobed cyst at the 6 o'clock position 6 cm from the nipple
measuring 5 mm in total, demonstrating posterior acoustic
enhancement and no internal power Doppler flow.

-Benign clustered cysts at the 9 o'clock position 4 cm from nipple
measuring approximately 7 x 3 x 5 mm, demonstrating posterior
acoustic enhancement and no internal power Doppler.

No suspicious solid mass or abnormal acoustic shadowing is
identified.
IMPRESSION: Benign cysts and clustered cysts in the LEFT breast which account
for the screening mammographic findings.

RECOMMENDATION:
Screening mammogram in one year.(Code:SZ-H-QAO)

I have discussed the findings and recommendations with the patient.
If applicable, a reminder letter will be sent to the patient
regarding the next appointment.

BI-RADS CATEGORY  2: Benign.
